# Patient Record
Sex: Female | Born: 1995 | Race: Black or African American | Hispanic: No | Marital: Single | Smoking: Former smoker
Health system: Southern US, Community
[De-identification: ages and names within clinical notes are randomized; demographics above are authoritative.]

## PROBLEM LIST (undated history)

## (undated) DIAGNOSIS — J45909 Unspecified asthma, uncomplicated: Secondary | ICD-10-CM

## (undated) HISTORY — PX: THERAPEUTIC ABORTION: SHX798

---
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
## 2015-12-30 ENCOUNTER — Emergency Department (HOSPITAL_COMMUNITY): Payer: 59

## 2015-12-30 ENCOUNTER — Encounter (HOSPITAL_COMMUNITY): Payer: Self-pay | Admitting: Emergency Medicine

## 2015-12-30 ENCOUNTER — Emergency Department (HOSPITAL_COMMUNITY)
Admission: EM | Admit: 2015-12-30 | Discharge: 2015-12-31 | Disposition: A | Discharge status: Home / Self Care | Payer: 59 | Attending: Emergency Medicine | Admitting: Emergency Medicine

## 2015-12-30 DIAGNOSIS — J45901 Unspecified asthma with (acute) exacerbation: Secondary | ICD-10-CM | POA: Diagnosis not present

## 2015-12-30 DIAGNOSIS — R0602 Shortness of breath: Secondary | ICD-10-CM

## 2015-12-30 DIAGNOSIS — Z791 Long term (current) use of non-steroidal anti-inflammatories (NSAID): Secondary | ICD-10-CM | POA: Insufficient documentation

## 2015-12-30 DIAGNOSIS — F17200 Nicotine dependence, unspecified, uncomplicated: Secondary | ICD-10-CM | POA: Diagnosis not present

## 2015-12-30 HISTORY — DX: Unspecified asthma, uncomplicated: J45.909

## 2015-12-30 LAB — BASIC METABOLIC PANEL
Anion gap: 11 (ref 5–15)
BUN: 11 mg/dL (ref 6–20)
CHLORIDE: 109 mmol/L (ref 101–111)
CO2: 20 mmol/L — ABNORMAL LOW (ref 22–32)
Calcium: 9 mg/dL (ref 8.9–10.3)
Creatinine, Ser: 0.93 mg/dL (ref 0.44–1.00)
GFR calc Af Amer: >60 mL/min (ref >60)
GFR calc non Af Amer: >60 mL/min (ref >60)
Glucose, Bld: 137 mg/dL — ABNORMAL HIGH (ref 65–99)
POTASSIUM: 3.3 mmol/L — AB (ref 3.5–5.1)
SODIUM: 140 mmol/L (ref 135–145)

## 2015-12-30 LAB — CBC WITH DIFFERENTIAL/PLATELET
Basophils Absolute: 0 10*3/uL (ref 0.0–0.1)
Basophils Relative: 0 %
Eosinophils Absolute: 0.8 10*3/uL — ABNORMAL HIGH (ref 0.0–0.7)
Eosinophils Relative: 7 %
HCT: 42 % (ref 36.0–46.0)
HEMOGLOBIN: 14.4 g/dL (ref 12.0–15.0)
LYMPHS ABS: 4.4 10*3/uL — AB (ref 0.7–4.0)
LYMPHS PCT: 40 %
MCH: 27.6 pg (ref 26.0–34.0)
MCHC: 34.3 g/dL (ref 30.0–36.0)
MCV: 80.6 fL (ref 78.0–100.0)
Monocytes Absolute: 0.6 10*3/uL (ref 0.1–1.0)
Monocytes Relative: 6 %
NEUTROS PCT: 47 %
Neutro Abs: 5.2 10*3/uL (ref 1.7–7.7)
Platelets: 238 10*3/uL (ref 150–400)
RBC: 5.21 MIL/uL — AB (ref 3.87–5.11)
RDW: 15.2 % (ref 11.5–15.5)
WBC: 11 10*3/uL — AB (ref 4.0–10.5)

## 2015-12-30 MED ORDER — METHYLPREDNISOLONE SODIUM SUCC 125 MG IJ SOLR
125.0000 mg | Freq: Once | INTRAMUSCULAR | Status: AC
  Administered 2015-12-30: 125 mg via INTRAVENOUS
  Filled 2015-12-30: qty 2

## 2015-12-30 MED ORDER — ALBUTEROL SULFATE (2.5 MG/3ML) 0.083% IN NEBU
5.0000 mg | INHALATION_SOLUTION | Freq: Once | RESPIRATORY_TRACT | Status: AC
  Administered 2015-12-30: 5 mg via RESPIRATORY_TRACT
  Filled 2015-12-30: qty 6

## 2015-12-30 MED ORDER — ALBUTEROL (5 MG/ML) CONTINUOUS INHALATION SOLN
15.0000 mg | INHALATION_SOLUTION | RESPIRATORY_TRACT | Status: DC
  Administered 2015-12-30: 15 mg via RESPIRATORY_TRACT

## 2015-12-30 MED ORDER — IPRATROPIUM BROMIDE 0.02 % IN SOLN
RESPIRATORY_TRACT | Status: AC
  Filled 2015-12-30: qty 2.5

## 2015-12-30 MED ORDER — IPRATROPIUM BROMIDE 0.02 % IN SOLN
0.5000 mg | Freq: Once | RESPIRATORY_TRACT | Status: AC
  Administered 2015-12-30: 0.5 mg via RESPIRATORY_TRACT

## 2015-12-30 NOTE — ED Triage Notes (Signed)
Pt was assisted out of a car with c/o shortness of breath  Pt has labored breathing and audible wheezing  Pt unable to speak full sentences

## 2015-12-30 NOTE — ED Provider Notes (Signed)
By signing my name below, I, Emmanuella Mensah, attest that this documentation has been prepared under the direction and in the presence of Tacie Mccuistion N Paidyn Mcferran, DO. Electronically Signed: Angelene GiovanniEmmanuella Mensah, ED Scribe. 12/30/15. 11:11 PM.   TIME SEEN: 11:04 PM   CHIEF COMPLAINT: Shortness of Breath  HPI: Jaime Herman is a 20 y.o. female with a hx of asthma who presents to the Emergency Department complaining of gradually worsening moderate difficulty breathing consistent with her asthma exacerbation onset PTA. She reports associated labored breathing and difficulty speaking in complete sentences. She adds that she has had a non-productive cough for the past 3 days. No alleviating factors noted. She states that she has finished her inhaler and has not been able refill her prescription because her PCP is back in [place], [place]. No other medications noted PTA. She denies any past hospital admissions for these symptoms. No history of PE, DVT, exogenous estrogen use, fracture, surgery, trauma, hospitalization, prolonged travel. No lower extremity swelling or pain. No calf tenderness.  She states that she recently had a therapeutic abortion 2 weeks ago. She reports that she is current smoker. She denies any home O2 use. She denies any fever, chills, or chest pain.    ROS: See HPI Constitutional: no fever  Eyes: no drainage  ENT: no runny nose   Cardiovascular:  no chest pain  Resp: SOB  GI: no vomiting GU: no dysuria Integumentary: no rash  Allergy: no hives  Musculoskeletal: no leg swelling  Neurological: no slurred speech ROS otherwise negative  PAST MEDICAL HISTORY/PAST SURGICAL HISTORY:  Past Medical History:  Diagnosis Date  . Asthma     MEDICATIONS:  Prior to Admission medications   Medication Sig Start Date End Date Taking? Authorizing Provider  valACYclovir (VALTREX) 1000 MG tablet Take 1 tablet (1,000 mg total) by mouth 2 (two) times daily. 07/25/12   Linna HoffJames D Kindl, MD    ALLERGIES:   No Known Allergies  SOCIAL HISTORY:  Social History  Substance Use Topics  . Smoking status: Current Every Day Smoker  . Smokeless tobacco: Never Used  . Alcohol use Yes     Comment: rare    FAMILY HISTORY: No family history on file.  EXAM: BP (!) 151/123 (BP Location: Right Arm)   Pulse (!) 131   Temp 97.7 F (36.5 C) (Oral)   Resp 25   SpO2 94% Comment: 2 L CONSTITUTIONAL: Alert and oriented and responds appropriately to questions. Well-appearing; well-nourished HEAD: Normocephalic EYES: Conjunctivae clear, PERRL ENT: normal nose; no rhinorrhea; moist mucous membranes NECK: Supple, no meningismus, no LAD  CARD: Regular and tachycardiac; S1 and S2 appreciated; no murmurs, no clicks, no rubs, no gallops RESP: Diffuse expiratory wheezes, no rhonchi or rales. Diminished at bases bilaterally. Speaking short sentences. Stats 80 % on RA; Tachypneic.  ABD/GI: Normal bowel sounds; non-distended; soft, non-tender, no rebound, no guarding, no peritoneal signs BACK:  The back appears normal and is non-tender to palpation, there is no CVA tenderness EXT: Normal ROM in all joints; non-tender to palpation; no edema; normal capillary refill; no cyanosis, no calf tenderness or swelling    SKIN: Normal color for age and race; warm; no rash NEURO: Moves all extremities equally, sensation to light touch intact diffusely, cranial nerves II through XII intact PSYCH: The patient's mood and manner are appropriate. Grooming and personal hygiene are appropriate.  MEDICAL DECISION MAKING: Patient here with asthma exacerbation. Was hypoxic, tachycardic, tachypneic. Speaking short sentences. Received 5 mg of albuterol with minimal improvement.  We'll give continuous albuterol, Atrovent, Solu-Medrol. We'll obtain labs, chest x-ray. No history of PE or DVT. No risk factors for the same.  ED PROGRESS: 12:35 AM  Pt's Labs are unremarkable. Chest x-ray is clear.  Patient still mildly tachycardic but oxygen  saturation is now 95% on room air. Surrounding some mild scattered x-ray wheezing but reports feeling "much better". We'll give another albuterol, Atrovent treatment, reassess and he really patient without oxygen.    Patient's lungs are now clear after albuterol, Atrovent. Able to ambulate without her oxygen saturation dropping below 95%. Heart rate has also improved. I feel she is safe to be discharged home. We'll discharge with albuterol inhaler and prednisone burst. Have given her local PCP follow-up information. Have recommended she quit smoking. Discussed return precautions. She verbalized understanding and is comfortable with this plan.   At this time, I do not feel there is any life-threatening condition present. I have reviewed and discussed all results (EKG, imaging, lab, urine as appropriate), exam findings with patient/family. I have reviewed nursing notes and appropriate previous records.  I feel the patient is safe to be discharged home without further emergent workup and can continue workup as an outpatient as needed. Discussed usual and customary return precautions. Patient/family verbalize understanding and are comfortable with this plan.  Outpatient follow-up has been provided. All questions have been answered.    CRITICAL CARE Performed by: Raelyn Number   Total critical care time: 45 minutes  Critical care time was exclusive of separately billable procedures and treating other patients.  Critical care was necessary to treat or prevent imminent or life-threatening deterioration.  Critical care was time spent personally by me on the following activities: development of treatment plan with patient and/or surrogate as well as nursing, discussions with consultants, evaluation of patient's response to treatment, examination of patient, obtaining history from patient or surrogate, ordering and performing treatments and interventions, ordering and review of laboratory studies,  ordering and review of radiographic studies, pulse oximetry and re-evaluation of patient's condition.    EKG Interpretation  Date/Time:  Tuesday December 30 2015 23:20:56 EDT Ventricular Rate:  106 PR Interval:    QRS Duration: 92 QT Interval:  339 QTC Calculation: 451 R Axis:   74 Text Interpretation:  Sinus tachycardia Biatrial enlargement RSR' in V1 or V2, right VCD or RVH Artifact in lead(s) I II III aVR aVL aVF V4 V5 No old tracing to compare Confirmed by Colm Lyford,  DO, Jelan Batterton ([id number]) on 12/30/2015 11:44:37 PM       I personally performed the services described in this documentation, which was scribed in my presence. The recorded information has been reviewed and is accurate.    Layla Maw Rolen Conger, DO 12/31/15 0500

## 2015-12-31 MED ORDER — PREDNISONE 20 MG PO TABS: 60.0000 mg | ORAL_TABLET | Freq: Every day | ORAL | 0 refills | Status: DC

## 2015-12-31 MED ORDER — ALBUTEROL SULFATE HFA 108 (90 BASE) MCG/ACT IN AERS: 2.0000 | INHALATION_SPRAY | RESPIRATORY_TRACT | 3 refills | Status: AC | PRN

## 2015-12-31 MED ORDER — ALBUTEROL SULFATE (2.5 MG/3ML) 0.083% IN NEBU
5.0000 mg | INHALATION_SOLUTION | Freq: Once | RESPIRATORY_TRACT | Status: AC
  Administered 2015-12-31: 5 mg via RESPIRATORY_TRACT
  Filled 2015-12-31: qty 6

## 2015-12-31 MED ORDER — IPRATROPIUM BROMIDE 0.02 % IN SOLN
1.0000 mg | Freq: Once | RESPIRATORY_TRACT | Status: AC
  Administered 2015-12-31: 1 mg via RESPIRATORY_TRACT
  Filled 2015-12-31: qty 5

## 2015-12-31 MED ORDER — ALBUTEROL SULFATE HFA 108 (90 BASE) MCG/ACT IN AERS
2.0000 | INHALATION_SPRAY | RESPIRATORY_TRACT | Status: DC | PRN
  Administered 2015-12-31: 2 via RESPIRATORY_TRACT
  Filled 2015-12-31: qty 6.7

## 2015-12-31 NOTE — ED Notes (Signed)
Pt O2 was 96% and pulse was 108

## 2015-12-31 NOTE — Discharge Instructions (Signed)
Please follow-up with your primary care physician or a local primary care physician next 2-3 days.   To find a primary care or specialty doctor please call [phone] or [phone] to access "[place] Find a Doctor Service."  You may also go on the [facility] website at [email or web address]  There are also multiple [facility] practices throughout the [place] that are frequently accepting new patients. You may find a clinic that is close to your home and contact them.  [facility] -  [address] [place] [id number] [phone]  [facility] in [place] (also locations in [place] and [place]) -  [address] [place] [address] [phone]  [facility] -  [address] [place] [address] [phone]

## 2015-12-31 NOTE — ED Notes (Signed)
Patient was alert, oriented and stable upon discharge. RN went over AVS and patient had no further questions.  

## 2016-02-04 ENCOUNTER — Encounter (HOSPITAL_COMMUNITY): Payer: Self-pay | Admitting: *Deleted

## 2016-02-04 ENCOUNTER — Emergency Department (HOSPITAL_COMMUNITY)
Admission: EM | Admit: 2016-02-04 | Discharge: 2016-02-04 | Disposition: A | Discharge status: Home / Self Care | Payer: 59 | Attending: Emergency Medicine | Admitting: Emergency Medicine

## 2016-02-04 DIAGNOSIS — J4521 Mild intermittent asthma with (acute) exacerbation: Secondary | ICD-10-CM | POA: Diagnosis not present

## 2016-02-04 DIAGNOSIS — F17200 Nicotine dependence, unspecified, uncomplicated: Secondary | ICD-10-CM | POA: Insufficient documentation

## 2016-02-04 DIAGNOSIS — R0602 Shortness of breath: Secondary | ICD-10-CM | POA: Diagnosis present

## 2016-02-04 MED ORDER — PREDNISONE 20 MG PO TABS
40.0000 mg | ORAL_TABLET | Freq: Once | ORAL | Status: AC
  Administered 2016-02-04: 40 mg via ORAL
  Filled 2016-02-04: qty 2

## 2016-02-04 MED ORDER — GUAIFENESIN-CODEINE 100-10 MG/5ML PO SOLN
5.0000 mL | Freq: Once | ORAL | Status: AC
  Administered 2016-02-04: 5 mL via ORAL
  Filled 2016-02-04: qty 5

## 2016-02-04 MED ORDER — ALBUTEROL SULFATE (2.5 MG/3ML) 0.083% IN NEBU
5.0000 mg | INHALATION_SOLUTION | Freq: Once | RESPIRATORY_TRACT | Status: AC
  Administered 2016-02-04: 5 mg via RESPIRATORY_TRACT
  Filled 2016-02-04: qty 6

## 2016-02-04 MED ORDER — PREDNISONE 10 MG PO TABS: 20.0000 mg | ORAL_TABLET | Freq: Two times a day (BID) | ORAL | 0 refills | Status: DC

## 2016-02-04 MED ORDER — IPRATROPIUM-ALBUTEROL 0.5-2.5 (3) MG/3ML IN SOLN
3.0000 mL | Freq: Once | RESPIRATORY_TRACT | Status: AC
  Administered 2016-02-04: 3 mL via RESPIRATORY_TRACT
  Filled 2016-02-04: qty 3

## 2016-02-04 NOTE — ED Provider Notes (Signed)
[facility]EMERGENCY DEPT Provider Note   CSN: [id number] Arrival date & time: 02/04/16  2006  By signing my name below, I, Jaime Herman, attest that this documentation has been prepared under the direction and in the presence of Kerrie BuffaloHope Neese, NP. Electronically Signed: Angelene GiovanniEmmanuella Herman, ED Scribe. 02/04/16. 11:26 PM.   History   Chief Complaint Chief Complaint  Patient presents with  . Shortness of Breath    HPI Comments: Jaime Herman is a 20 y.o. female with a hx of asthma who presents to the Emergency Department complaining of gradual onset, moderate non-productive cough onset several days ago. She reports associated one episode of post tussive vomiting, wheezing, and chest tightness consistent with her asthma exacerbations. She notes that her asthma is worse with the cold weather and when she smokes marijuana. No alleviating factors noted. She reports that she has needed to use her inhaler everyday this past week with mild temporary relief. Pt has NKDA but states that she has noticed that when she takes ibuprofen, she has an asthma flare up so she does not take it anymore. She denies any fever, chills, nausea, generalized rash, or any other symptoms.   The history is provided by the patient. No language interpreter was used.  Wheezing   This is a chronic problem. The current episode started 2 days ago. The problem has been gradually worsening. Associated symptoms include vomiting (post tussive) and cough. Pertinent negatives include no fever and no rash. The problem's precipitants include smoke. Treatments tried: Albuterol inhaler. The treatment provided mild relief. She has had no prior hospitalizations. She has had prior ED visits. She has had no prior ICU admissions. Her past medical history is significant for asthma.    Past Medical History:  Diagnosis Date  . Asthma     There are no active problems to display for this patient.   Past Surgical History:  Procedure Laterality  Date  . THERAPEUTIC ABORTION      OB History    No data available       Home Medications    Prior to Admission medications   Medication Sig Start Date End Date Taking? Authorizing Provider  albuterol (PROVENTIL HFA;VENTOLIN HFA) 108 (90 Base) MCG/ACT inhaler Inhale 2-4 puffs into the lungs every 4 (four) hours as needed for wheezing or shortness of breath. 12/31/15  Yes Kristen N Ward, DO  predniSONE (DELTASONE) 10 MG tablet Take 2 tablets (20 mg total) by mouth 2 (two) times daily with a meal. 02/04/16   Hope Orlene OchM Neese, NP    Family History No family history on file.  Social History Social History  Substance Use Topics  . Smoking status: Current Every Day Smoker  . Smokeless tobacco: Never Used  . Alcohol use Yes     Comment: rare     Allergies   Ibuprofen   Review of Systems Review of Systems  Constitutional: Negative for chills and fever.  Respiratory: Positive for cough, chest tightness and wheezing.   Gastrointestinal: Positive for vomiting (post tussive). Negative for nausea.  Skin: Negative for rash.     Physical Exam Updated Vital Signs BP 112/64 (BP Location: Right Arm)   Pulse 92   Temp 98.5 F (36.9 C) (Oral)   Resp 16   Ht 5' 3.5" (1.613 m)   Wt 68 kg   LMP 02/03/2016   SpO2 100%   BMI 26.15 kg/m   Physical Exam  Constitutional: She is oriented to person, place, and time. She appears well-developed and well-nourished.  No distress.  HENT:  Head: Normocephalic and atraumatic.  Mouth/Throat: Uvula is midline and oropharynx is clear and moist. No posterior oropharyngeal edema or posterior oropharyngeal erythema.  Eyes: Conjunctivae and EOM are normal.  Neck: Neck supple. No tracheal deviation present.  Cardiovascular: Normal rate.   Pulmonary/Chest: Effort normal. No respiratory distress. She has wheezes.  Inspiratory and expiratory wheezes bilaterally   Musculoskeletal: Normal range of motion.  Neurological: She is alert and oriented to  person, place, and time.  Skin: Skin is warm and dry.  Psychiatric: She has a normal mood and affect. Her behavior is normal.  Nursing note and vitals reviewed.    ED Treatments / Results  DIAGNOSTIC STUDIES: Oxygen Saturation is 97% on RA, normal by my interpretation.    COORDINATION OF CARE: 9:57 PM- Pt advised of plan for treatment and pt agrees. Pt will receive guaifenesin-codeine, prednisone, and neb treatment x2.   11:25 PM - After 2nd duo neb treatment, pt's lungs are clear and she reports that she has improved and is feeling better.   Labs (all labs ordered are listed, but only abnormal results are displayed) Labs Reviewed - No data to display  Radiology No results found.  Procedures Procedures (including critical care time)  Medications Ordered in ED Medications  albuterol (PROVENTIL) (2.5 MG/3ML) 0.083% nebulizer solution 5 mg (5 mg Nebulization Given 02/04/16 2055)  ipratropium-albuterol (DUONEB) 0.5-2.5 (3) MG/3ML nebulizer solution 3 mL (3 mLs Nebulization Given 02/04/16 2229)  predniSONE (DELTASONE) tablet 40 mg (40 mg Oral Given 02/04/16 2227)  guaiFENesin-codeine 100-10 MG/5ML solution 5 mL (5 mLs Oral Given 02/04/16 2227)     Initial Impression / Assessment and Plan / ED Course  Kerrie BuffaloHope Neese, NP has reviewed the triage vital signs and the nursing notes.   Clinical Course   Patient ambulated in ED with O2 saturations maintained >90, no current signs of respiratory distress. Lung exam improved after nebulizer treatment. Prednisone given in the ED and pt will be dc with 5 day burst. Pt states sheis  breathing at baseline. Pt has been instructed to continue using prescribed medications and to speak with PCP about today's exacerbation.    Final Clinical Impressions(s) / ED Diagnoses   Final diagnoses:  Mild intermittent asthma with exacerbation    New Prescriptions Discharge Medication List as of 02/04/2016 11:25 PM     I personally performed the services  described in this documentation, which was scribed in my presence. The recorded information has been reviewed and is accurate.    [facility] Orlene OchM Neese, NP 02/05/16 0143    Layla MawKristen N Ward, DO 02/05/16 [phone]

## 2016-02-04 NOTE — ED Triage Notes (Signed)
Patient presents with c/o dry cough "forever", wheezing and feeling SOB

## 2016-02-04 NOTE — ED Notes (Signed)
See EDP assessment 

## 2017-03-22 NOTE — L&D Delivery Note (Addendum)
Delivery Note Pt labored quickly on cytotec to complete. She involuntarily begun pushing at 8-9cm.  At 5:17 AM a viable female was delivered via Vaginal, Spontaneous (Presentation:LOA ;  ).  APGAR: 7, 9; weight pending.  I arrived a minute after baby delivered. I clamped the cord and FOB cut it.  Numbed perineum with 1% lidocaine and performed repair of second degree laceration. Pt tolerated well Placental status:delivered, intact, Cord:3vc with the following complications: none.  Cord pH: n/a  Anesthesia:  1% lidocaine Episiotomy: None Lacerations: 2nd degree;Perineal;Periurethral Suture Repair: 2.0 vicryl Est. Blood Loss (mL): 530  Mom to postpartum.  Baby to Couplet care / Skin to Skin  Circ to be performed in office .  Cathrine MusterCecilia W Jaquelin Meaney 02/25/2018, 5:55 AM

## 2017-05-20 IMAGING — DX DG CHEST 1V PORT
1 series · 1 of 1 positions shown · non-contrast
Comparison: None.

CLINICAL DATA: Acute onset of shortness of breath. Initial
encounter.

EXAM:
PORTABLE CHEST 1 VIEW

[chest ap]
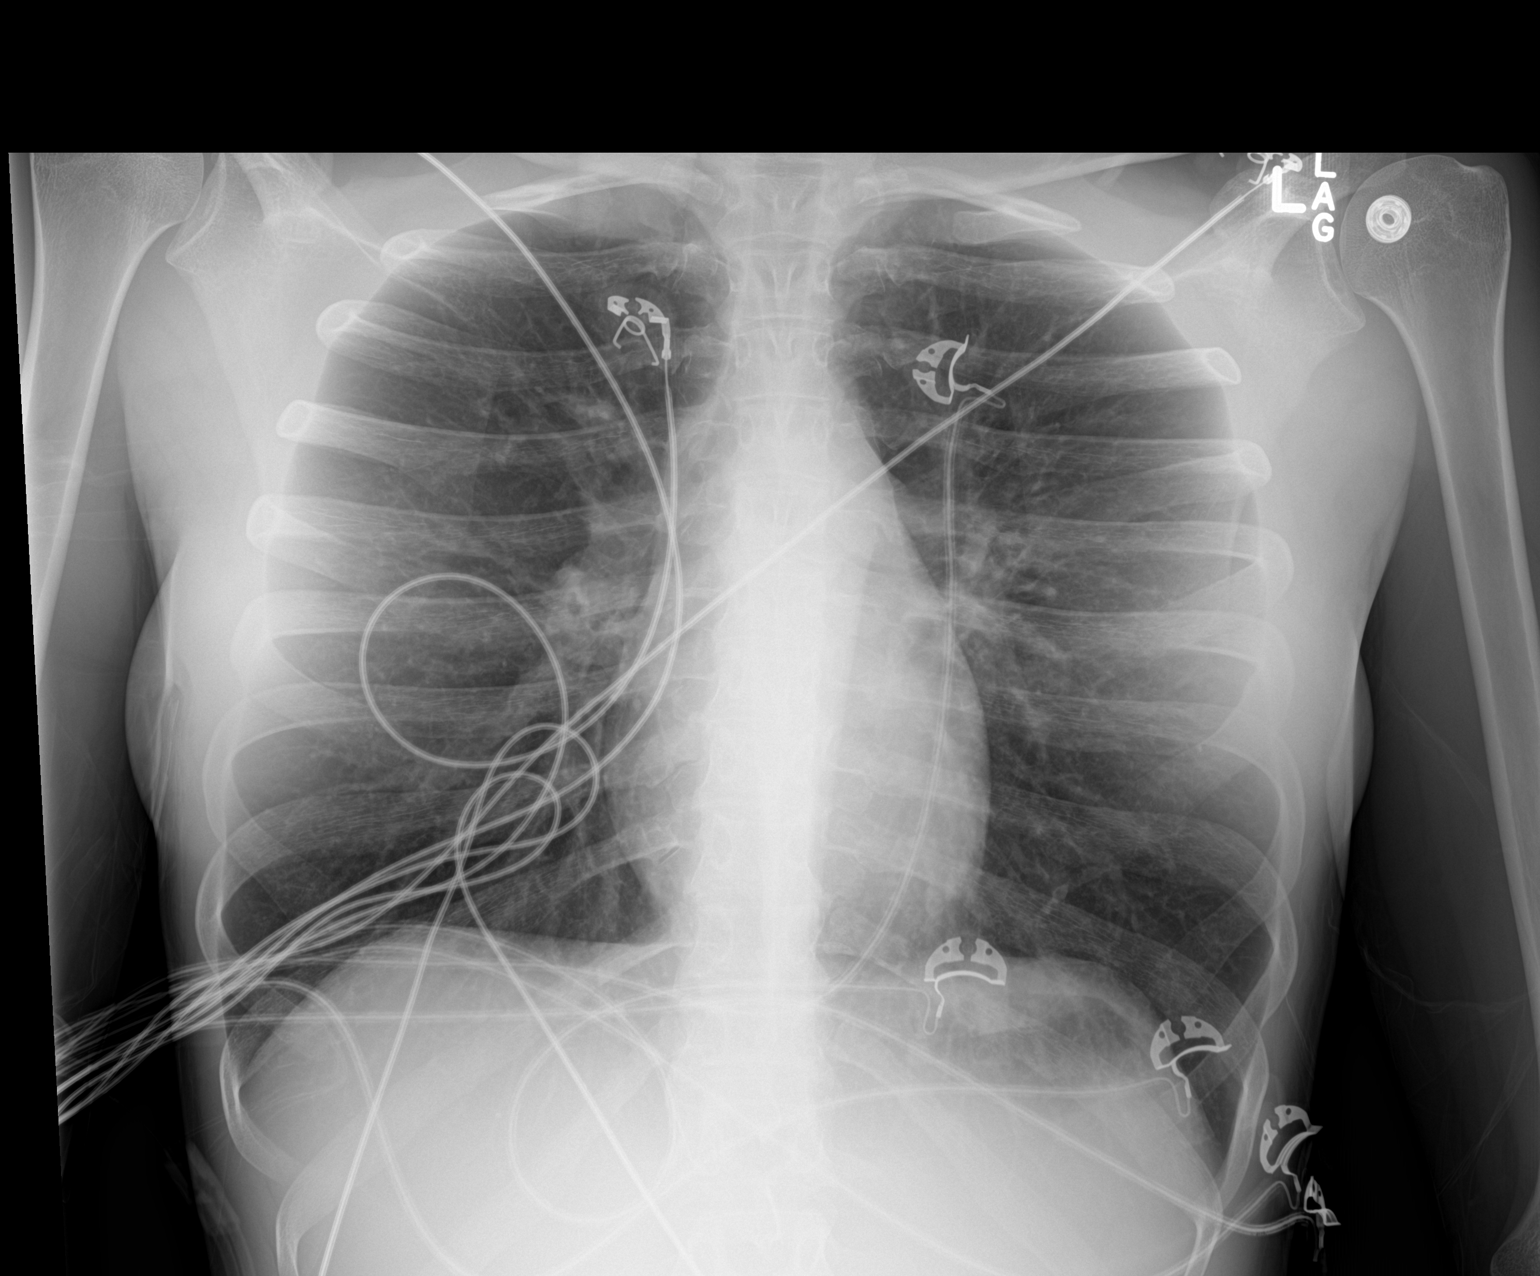

[1 of 1 positions shown; findings below may reference images not displayed]

FINDINGS: The lungs are well-aerated and clear. There is no evidence of focal
opacification, pleural effusion or pneumothorax.

The cardiomediastinal silhouette is within normal limits. No acute
osseous abnormalities are seen.
IMPRESSION: No acute cardiopulmonary process seen.

## 2017-08-12 ENCOUNTER — Encounter: Payer: Self-pay | Admitting: Pulmonary Disease

## 2017-08-12 ENCOUNTER — Ambulatory Visit (INDEPENDENT_AMBULATORY_CARE_PROVIDER_SITE_OTHER): Payer: 59 | Admitting: Pulmonary Disease

## 2017-08-12 VITALS — BP 128/70 | HR 88 | Ht 63.0 in | Wt 161.0 lb

## 2017-08-12 DIAGNOSIS — J4541 Moderate persistent asthma with (acute) exacerbation: Secondary | ICD-10-CM | POA: Diagnosis not present

## 2017-08-12 DIAGNOSIS — J339 Nasal polyp, unspecified: Secondary | ICD-10-CM | POA: Diagnosis not present

## 2017-08-12 DIAGNOSIS — J45909 Unspecified asthma, uncomplicated: Secondary | ICD-10-CM | POA: Diagnosis not present

## 2017-08-12 LAB — NITRIC OXIDE: NITRIC OXIDE: 201

## 2017-08-12 MED ORDER — MOMETASONE FUROATE 220 MCG/INH IN AEPB: 2.0000 | INHALATION_SPRAY | Freq: Two times a day (BID) | RESPIRATORY_TRACT | 0 refills | Status: DC

## 2017-08-12 MED ORDER — ALBUTEROL SULFATE HFA 108 (90 BASE) MCG/ACT IN AERS
2.00 | INHALATION_SPRAY | RESPIRATORY_TRACT | Status: DC
Start: ? — End: 2017-08-12

## 2017-08-12 MED ORDER — MOMETASONE FUROATE 220 MCG/INH IN AEPB: 2.0000 | INHALATION_SPRAY | Freq: Two times a day (BID) | RESPIRATORY_TRACT | 11 refills | Status: AC

## 2017-08-12 MED ORDER — ALBUTEROL SULFATE (5 MG/ML) 0.5% IN NEBU
20.00 | INHALATION_SOLUTION | RESPIRATORY_TRACT | Status: DC
Start: ? — End: 2017-08-12

## 2017-08-12 NOTE — Progress Notes (Signed)
Synopsis: Referred in May 2019 for Asthma  Subjective:   PATIENT ID: Jaime Herman GENDER: female DOB: 1995/11/10, MRN: [id number]   HPI  Chief Complaint  Patient presents with  . New Consult    frequent hospitalizations with Asthma flair ups    She is a 22 year old female who comes to my clinic today for evaluation of worsening chest tightness wheezing and shortness of breath.  She says that she did not have asthma as a child nor did she have problems with allergies.  However, when she moved to [place] in 2014 at age 1 she developed the abrupt onset of allergy symptoms.  Specifically she started having problems with nasal polyps, sinus congestion and postnasal drip as well as chest tightness wheezing and shortness of breath.  She has been to the emergency room several times for chest tightness, asthma flareups.  She has required prednisone on a recurrent basis for this.  She says that once her primary care physician gave her samples of Breo and this controlled her symptoms near completely.  However, since becoming pregnant [redacted] weeks ago she has not had any controller medicines and she feels that her symptoms are worsening.  She says she is now using albuterol up to 5 times a day to help relieve the sensation of shortness of breath.  She says that she coughs but is typically dry.  She does continue to have significant obstruction of airflow through her nose.  She has significant postnasal drip.  She is scheduled to see an ear nose and throat physician for nasal polyps.  She used to smoke marijuana cigars up to 5 times per day.  She quit smoking when she learned that she was pregnant.  She does not smoke cigarettes.  She currently does not work, in the past she has worked in Personnel officer.  Her boyfriend smokes in the house  Past Medical History:  Diagnosis Date  . Asthma      History reviewed. No pertinent family history.   Social History   Socioeconomic History  . Marital status:  Single    Spouse name: Not on file  . Number of children: Not on file  . Years of education: Not on file  . Highest education level: Not on file  Occupational History  . Not on file  Social Needs  . Financial resource strain: Not on file  . Food insecurity:    Worry: Not on file    Inability: Not on file  . Transportation needs:    Medical: Not on file    Non-medical: Not on file  Tobacco Use  . Smoking status: Former Smoker    Years: 6.00    Last attempt to quit: 06/29/2017    Years since quitting: 0.1  . Smokeless tobacco: Never Used  . Tobacco comment: THC  Substance and Sexual Activity  . Alcohol use: Yes    Comment: rare  . Drug use: Yes    Types: Marijuana  . Sexual activity: Not on file  Lifestyle  . Physical activity:    Days per week: Not on file    Minutes per session: Not on file  . Stress: Not on file  Relationships  . Social connections:    Talks on phone: Not on file    Gets together: Not on file    Attends religious service: Not on file    Active member of club or organization: Not on file    Attends meetings of clubs or organizations: Not  on file    Relationship status: Not on file  . Intimate partner violence:    Fear of current or ex partner: Not on file    Emotionally abused: Not on file    Physically abused: Not on file    Forced sexual activity: Not on file  Other Topics Concern  . Not on file  Social History Narrative  . Not on file     Allergies  Allergen Reactions  . Ibuprofen     Cant breath     Outpatient Medications Prior to Visit  Medication Sig Dispense Refill  . albuterol (PROVENTIL HFA;VENTOLIN HFA) 108 (90 Base) MCG/ACT inhaler Inhale 2-4 puffs into the lungs every 4 (four) hours as needed for wheezing or shortness of breath. 1 Inhaler 3  . predniSONE (DELTASONE) 10 MG tablet Take 2 tablets (20 mg total) by mouth 2 (two) times daily with a meal. 16 tablet 0   No facility-administered medications prior to visit.      Review of Systems  Constitutional: Negative for chills, fever, malaise/fatigue and weight loss.  HENT: Positive for congestion. Negative for nosebleeds, sinus pain and sore throat.   Eyes: Negative for photophobia, pain and discharge.  Respiratory: Positive for cough, shortness of breath and wheezing. Negative for hemoptysis and sputum production.   Cardiovascular: Negative for chest pain, palpitations, orthopnea and leg swelling.  Gastrointestinal: Negative for abdominal pain, constipation, diarrhea, nausea and vomiting.  Genitourinary: Negative for dysuria, frequency, hematuria and urgency.  Musculoskeletal: Negative for back pain, joint pain, myalgias and neck pain.  Skin: Negative for itching and rash.  Neurological: Negative for tingling, tremors, sensory change, speech change, focal weakness, seizures, weakness and headaches.  Endo/Heme/Allergies: Positive for environmental allergies.  Psychiatric/Behavioral: Negative for memory loss, substance abuse and suicidal ideas. The patient is nervous/anxious.       Objective:  Physical Exam   Vitals:   08/12/17 1124  BP: 128/70  Pulse: 88  SpO2: 98%  Weight: 161 lb (73 kg)  Height:  (1.6 m)   RA  Gen: well appearing, no acute distress HENT: NCAT, OP clear, neck supple without masses Eyes: PERRL, EOMi Lymph: no cervical lymphadenopathy PULM: Wheezing bilaterally, good air movement CV: RRR, no mgr, no JVD GI: BS+, soft, nontender, no hsm Derm: no rash or skin breakdown MSK: normal bulk and tone Neuro: A&Ox4, CN II-XII intact, strength 5/5 in all 4 extremities Psyche: normal mood and affect   CBC    Component Value Date/Time   WBC 11.0 (H) 12/30/2015 2315   RBC 5.21 (H) 12/30/2015 2315   HGB 14.4 12/30/2015 2315   HCT 42.0 12/30/2015 2315   PLT 238 12/30/2015 2315   MCV 80.6 12/30/2015 2315   MCH 27.6 12/30/2015 2315   MCHC 34.3 12/30/2015 2315   RDW 15.2 12/30/2015 2315   LYMPHSABS 4.4 (H) 12/30/2015  2315   MONOABS 0.6 12/30/2015 2315   EOSABS 0.8 (H) 12/30/2015 2315   BASOSABS 0.0 12/30/2015 2315     Chest imaging: October 2017 chest x-ray images independently reviewed showing hyperinflation, otherwise normal pulmonary parenchyma  PFT: May 2019 ratio 49%, FEV1 1.59 L 57% predicted, FVC 3.2 to 102% predicted  Exhaled NO: 07/2017 201 ppm  Labs:  Path:  Echo:  Heart Catheterization:  Records from her ER visit for an asthma exacerbation reviewed, they noted she was smoking marijuana prior to the visit.     Assessment & Plan:   Uncomplicated asthma, unspecified asthma severity, unspecified whether persistent - Plan:  Nitric oxide, Spirometry with Graph  Moderate persistent asthma with acute exacerbation  Nasal polyposis  Discussion: This is a pleasant 22 year old female who has poorly controlled asthma.  Symptoms have worsened since becoming pregnant but in general she has had fairly severe symptoms ever since moving here 5 years ago.  She describes fairly frequent exacerbations of her asthma and she requires albuterol use for more than normal.  This is complicated by allergic rhinitis and nasal polyps.  I explained to her that her exposure to tobacco smoke, her poorly controlled allergic rhinitis, and her pregnancy all contribute to the severity of her asthma.  I am hopeful that with the addition of a controller medicine we can see improvement in her symptoms.  In turn, having poorly controlled asthma is a risk factor for complications of pregnancy.   Moderate persistent asthma with recurrent exacerbations: Spirometry test today Exhaled nitric oxide test today Start Asmanex 220 puffs twice a day no matter how you feel Call us if you are still having to use albuterol more than twice per week after 2 weeks of Asmanex use We will plan on seeing you back in 6 weeks or sooner if needed  Allergic rhinitis with nasal polyps: Use Flonase over-the-counter 2 sprays each nostril  daily Keep follow-up appointment with ear nose and throat  We will see you back in 6 weeks or sooner if needed    Current Outpatient Medications:  .  albuterol (PROVENTIL HFA;VENTOLIN HFA) 108 (90 Base) MCG/ACT inhaler, Inhale 2-4 puffs into the lungs every 4 (four) hours as needed for wheezing or shortness of breath., Disp: 1 Inhaler, Rfl: 3

## 2017-08-12 NOTE — Patient Instructions (Signed)
Moderate persistent asthma with recurrent exacerbations: Spirometry test today Exhaled nitric oxide test today Start Asmanex 220 puffs twice a day no matter how you feel Call us if you are still having to use albuterol more than twice per week after 2 weeks of Asmanex use We will plan on seeing you back in 6 weeks or sooner if needed  Allergic rhinitis with nasal polyps: Use Flonase over-the-counter 2 sprays each nostril daily Keep follow-up appointment with ear nose and throat  We will see you back in 6 weeks or sooner if needed

## 2017-08-22 LAB — OB RESULTS CONSOLE GC/CHLAMYDIA
CHLAMYDIA, DNA PROBE: NEGATIVE
GC PROBE AMP, GENITAL: NEGATIVE

## 2017-08-22 LAB — OB RESULTS CONSOLE HIV ANTIBODY (ROUTINE TESTING): HIV: NONREACTIVE

## 2017-08-22 LAB — OB RESULTS CONSOLE RPR: RPR: NONREACTIVE

## 2017-08-22 LAB — OB RESULTS CONSOLE ABO/RH: RH Type: POSITIVE

## 2017-08-22 LAB — OB RESULTS CONSOLE HEPATITIS B SURFACE ANTIGEN: Hepatitis B Surface Ag: NEGATIVE

## 2017-08-22 LAB — OB RESULTS CONSOLE RUBELLA ANTIBODY, IGM: RUBELLA: IMMUNE

## 2017-08-22 LAB — OB RESULTS CONSOLE ANTIBODY SCREEN: Antibody Screen: NEGATIVE

## 2017-09-28 ENCOUNTER — Ambulatory Visit: Payer: 59 | Admitting: Pulmonary Disease

## 2017-09-28 NOTE — Progress Notes (Deleted)
   Synopsis: Referred in May 2019 for Asthma  Subjective:   PATIENT ID: Jaime Herman GENDER: female DOB: 06/19/1995, MRN: [id number]   HPI  No chief complaint on file.   ***  Past Medical History:  Diagnosis Date  . Asthma       Review of Systems  Constitutional: Negative for chills, fever, malaise/fatigue and weight loss.  HENT: Positive for congestion. Negative for nosebleeds, sinus pain and sore throat.   Eyes: Negative for photophobia, pain and discharge.  Respiratory: Positive for cough, shortness of breath and wheezing. Negative for hemoptysis and sputum production.   Cardiovascular: Negative for chest pain, palpitations, orthopnea and leg swelling.  Gastrointestinal: Negative for abdominal pain, constipation, diarrhea, nausea and vomiting.  Genitourinary: Negative for dysuria, frequency, hematuria and urgency.  Musculoskeletal: Negative for back pain, joint pain, myalgias and neck pain.  Skin: Negative for itching and rash.  Neurological: Negative for tingling, tremors, sensory change, speech change, focal weakness, seizures, weakness and headaches.  Endo/Heme/Allergies: Positive for environmental allergies.  Psychiatric/Behavioral: Negative for memory loss, substance abuse and suicidal ideas. The patient is nervous/anxious.       Objective:  Physical Exam   There were no vitals filed for this visit. RA  ***   CBC    Component Value Date/Time   WBC 11.0 (H) 12/30/2015 2315   RBC 5.21 (H) 12/30/2015 2315   HGB 14.4 12/30/2015 2315   HCT 42.0 12/30/2015 2315   PLT 238 12/30/2015 2315   MCV 80.6 12/30/2015 2315   MCH 27.6 12/30/2015 2315   MCHC 34.3 12/30/2015 2315   RDW 15.2 12/30/2015 2315   LYMPHSABS 4.4 (H) 12/30/2015 2315   MONOABS 0.6 12/30/2015 2315   EOSABS 0.8 (H) 12/30/2015 2315   BASOSABS 0.0 12/30/2015 2315     Chest imaging: October 2017 chest x-ray images independently reviewed showing hyperinflation, otherwise normal pulmonary  parenchyma  PFT: May 2019 ratio 49%, FEV1 1.59 L 57% predicted, FVC 3.2 to 102% predicted  Exhaled NO: 07/2017 201 ppm  Labs:  Path:  Echo:  Heart Catheterization:  Records from her ER visit for an asthma exacerbation reviewed, they noted she was smoking marijuana prior to the visit.     Assessment & Plan:   No diagnosis found.  ***   Current Outpatient Medications:  .  albuterol (PROVENTIL HFA;VENTOLIN HFA) 108 (90 Base) MCG/ACT inhaler, Inhale 2-4 puffs into the lungs every 4 (four) hours as needed for wheezing or shortness of breath., Disp: 1 Inhaler, Rfl: 3 .  mometasone (ASMANEX 60 METERED DOSES) 220 MCG/INH inhaler, Inhale 2 puffs into the lungs 2 (two) times daily., Disp: 1 Inhaler, Rfl: 11 .  mometasone (ASMANEX 60 METERED DOSES) 220 MCG/INH inhaler, Inhale 2 puffs into the lungs 2 (two) times daily., Disp: 1 Inhaler, Rfl: 0

## 2017-11-10 ENCOUNTER — Encounter: Payer: Self-pay | Admitting: Pulmonary Disease

## 2017-11-10 ENCOUNTER — Ambulatory Visit (INDEPENDENT_AMBULATORY_CARE_PROVIDER_SITE_OTHER): Payer: 59 | Admitting: Pulmonary Disease

## 2017-11-10 VITALS — BP 138/90 | HR 96 | Ht 64.57 in | Wt 194.0 lb

## 2017-11-10 DIAGNOSIS — Z886 Allergy status to analgesic agent status: Secondary | ICD-10-CM

## 2017-11-10 DIAGNOSIS — J339 Nasal polyp, unspecified: Secondary | ICD-10-CM

## 2017-11-10 DIAGNOSIS — J4541 Moderate persistent asthma with (acute) exacerbation: Secondary | ICD-10-CM | POA: Diagnosis not present

## 2017-11-10 DIAGNOSIS — J45909 Unspecified asthma, uncomplicated: Secondary | ICD-10-CM | POA: Diagnosis not present

## 2017-11-10 DIAGNOSIS — Z23 Encounter for immunization: Secondary | ICD-10-CM | POA: Diagnosis not present

## 2017-11-10 NOTE — Patient Instructions (Signed)
Eosinophilic asthma: Continue taking Asmanex as you are doing Use albuterol as needed for chest tightness wheezing or shortness of breath Flu shot today Stay active Practice good hand hygiene After the baby is born we will start backing down on the Asmanex  Nasal polyps: Continue follow-up with ENT Ask your primary care/obstetrician if you can take montelukast  Salicylate allergy: There are several resources available online that are informative in regards to nonmedicinal sources of salicylate in our diets and health and beauty a products.  I recommend you look these up.  We will see you back in February 2020 or sooner if needed

## 2017-11-10 NOTE — Progress Notes (Signed)
Synopsis: Referred in May 2019 for Asthma, has nasal polyps and salicylate allergy  Subjective:   PATIENT ID: Jaime Herman Davia GENDER: female DOB: 08/09/1995, MRN: [id number]   HPI  Chief Complaint  Patient presents with  . Follow-up    Fredric MareBailey has been doing much better since last visit.  She is compliant with her Asmanex which she says has been very helpful.  She has not had problems with chest tightness wheezing or shortness of breath.  She says that she has not had to use her albuterol.  No recent episodes of bronchitis.  She has been staying active.  Her due date is February 24, 2018.  She is still struggling with significant nasal polyps and congestion.  Past Medical History:  Diagnosis Date  . Asthma       Review of Systems  Constitutional: Negative for chills, fever, malaise/fatigue and weight loss.  HENT: Positive for congestion. Negative for nosebleeds, sinus pain and sore throat.   Eyes: Negative for photophobia, pain and discharge.  Respiratory: Positive for cough, shortness of breath and wheezing. Negative for hemoptysis and sputum production.   Cardiovascular: Negative for chest pain, palpitations, orthopnea and leg swelling.  Gastrointestinal: Negative for abdominal pain, constipation, diarrhea, nausea and vomiting.  Genitourinary: Negative for dysuria, frequency, hematuria and urgency.  Musculoskeletal: Negative for back pain, joint pain, myalgias and neck pain.  Skin: Negative for itching and rash.  Neurological: Negative for tingling, tremors, sensory change, speech change, focal weakness, seizures, weakness and headaches.  Endo/Heme/Allergies: Positive for environmental allergies.  Psychiatric/Behavioral: Negative for memory loss, substance abuse and suicidal ideas. The patient is nervous/anxious.       Objective:  Physical Exam   Vitals:   11/10/17 0935  BP: 138/90  Pulse: 96  SpO2: 99%  Weight: 194 lb (88 kg)  Height: 5' 4.57" (1.64 m)    RA  Gen: well appearing HENT: OP clear, TM's clear, neck supple PULM: CTA B, normal percussion CV: RRR, no mgr, trace edema GI: BS+, soft, nontender Derm: no cyanosis or rash Psyche: normal mood and affect   CBC    Component Value Date/Time   WBC 11.0 (H) 12/30/2015 2315   RBC 5.21 (H) 12/30/2015 2315   HGB 14.4 12/30/2015 2315   HCT 42.0 12/30/2015 2315   PLT 238 12/30/2015 2315   MCV 80.6 12/30/2015 2315   MCH 27.6 12/30/2015 2315   MCHC 34.3 12/30/2015 2315   RDW 15.2 12/30/2015 2315   LYMPHSABS 4.4 (H) 12/30/2015 2315   MONOABS 0.6 12/30/2015 2315   EOSABS 0.8 (H) 12/30/2015 2315   BASOSABS 0.0 12/30/2015 2315     Chest imaging: October 2017 chest x-ray images independently reviewed showing hyperinflation, otherwise normal pulmonary parenchyma  PFT: May 2019 ratio 49%, FEV1 1.59 L 57% predicted, FVC 3.2 to 102% predicted  Exhaled NO: 07/2017 201 ppm  Labs:  Path:  Echo:  Heart Catheterization:  Records from her ER visit for an asthma exacerbation reviewed, they noted she was smoking marijuana prior to the visit.     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  Specifically she has nasal polyps, ibuprofen allergy, and significant asthma.  Her asthma symptoms have been more significantly controlled with the addition of Asmanex.  I would like for her to check with her obstetrician if  it is possible for her to take montelukast as this can be helpful for the nasal polyps.  I have encouraged her to use nasal steroids but she says that these have been ineffective.  Plan: Eosinophilic asthma: Continue taking Asmanex as you are doing Use albuterol as needed for chest tightness wheezing or shortness of breath Flu shot  today Stay active Practice good hand hygiene After the baby is born we will start backing down on the Asmanex  Nasal polyps: Continue follow-up with ENT Ask your primary care/obstetrician if you can take montelukast  Salicylate allergy: There are several resources available online that are informative in regards to nonmedicinal sources of salicylate in our diets and health and beauty a products.  I recommend you look these up.  We will see you back in February 2020 or sooner if needed    Current Outpatient Medications:  .  Prenatal Vit-Fe Fumarate-FA (PRENATAL MULTIVITAMIN) TABS tablet, Take 1 tablet by mouth daily at 12 noon., Disp: , Rfl:  .  albuterol (PROVENTIL HFA;VENTOLIN HFA) 108 (90 Base) MCG/ACT inhaler, Inhale 2-4 puffs into the lungs every 4 (four) hours as needed for wheezing or shortness of breath., Disp: 1 Inhaler, Rfl: 3 .  mometasone (ASMANEX 60 METERED DOSES) 220 MCG/INH inhaler, Inhale 2 puffs into the lungs 2 (two) times daily., Disp: 1 Inhaler, Rfl: 11

## 2018-02-06 LAB — OB RESULTS CONSOLE GBS: STREP GROUP B AG: POSITIVE

## 2018-02-20 ENCOUNTER — Encounter (HOSPITAL_COMMUNITY): Payer: Self-pay | Admitting: *Deleted

## 2018-02-20 ENCOUNTER — Telehealth (HOSPITAL_COMMUNITY): Payer: Self-pay | Admitting: *Deleted

## 2018-02-20 NOTE — Telephone Encounter (Signed)
Preadmission screen  

## 2018-02-24 NOTE — H&P (Deleted)
  The note originally documented on this encounter has been moved the the encounter in which it belongs.  

## 2018-02-24 NOTE — H&P (Signed)
Jaime Herman is a 22 y.o. G40P0020 female presenting at 55 1/7wks for elective iol at term with favorable cervix. She is dated per 13 week [place]. 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 GBS positive NKDA  OB History    Gravida  3   Para      Term      Preterm      AB  2   Living        SAB      TAB  2   Ectopic      Multiple      Live Births             Past Medical History:  Diagnosis Date  . Asthma    Past Surgical History:  Procedure Laterality Date  . THERAPEUTIC ABORTION     Family History: family history is not on file. Social History:  reports that she quit smoking about 7 months ago. She quit after 6.00 years of use. She has never used smokeless tobacco. She reports that she drinks alcohol. She reports that she has current or past drug history. Drug: Marijuana.     Maternal Diabetes: No Genetic Screening: Abnormal:  Results: Other:sma and cf carrier Maternal Ultrasounds/Referrals: Normal Fetal Ultrasounds or other Referrals:  None Maternal Substance Abuse:  No Significant Maternal Medications:  None Significant Maternal Lab Results:  Lab values include: Group B Strep positive Other Comments:  None  Review of Systems  Constitutional: Negative for chills, fever, malaise/fatigue and weight loss.  Eyes: Negative for blurred vision.  Respiratory: Negative for shortness of breath.   Cardiovascular: Negative for chest pain.  Gastrointestinal: Negative for heartburn, nausea and vomiting.  Genitourinary: Negative for dysuria.  Musculoskeletal: Negative for myalgias.  Skin: Negative for itching and rash.  Endo/Heme/Allergies: Does not bruise/bleed easily.  Psychiatric/Behavioral: Negative for depression, hallucinations, substance abuse and suicidal ideas. The patient is nervous/anxious.    Maternal Medical History:  Reason for admission: Nausea. Term pregnancy with favorable cervix  Contractions: Frequency: rare.   Perceived severity is mild.    Fetal  activity: Perceived fetal activity is normal.   Last perceived fetal movement was within the past hour.    Prenatal complications: no prenatal complications Prenatal Complications - Diabetes: none.      There were no vitals taken for this visit. Maternal Exam:  Uterine Assessment: Contraction frequency is rare.   Abdomen: Patient reports generalized tenderness.  Estimated fetal weight is AGA.   Fetal presentation: vertex  Introitus: Normal vulva. Vulva is negative for condylomata and lesion.  Normal vagina.  Vagina is negative for condylomata.  Pelvis: adequate for delivery.   Cervix: Cervix evaluated by digital exam.     Physical Exam  Constitutional: She is oriented to person, place, and time. She appears well-developed and well-nourished.  Eyes: Pupils are equal, round, and reactive to light.  Neck: Normal range of motion.  Cardiovascular: Normal rate.  Respiratory: Effort normal.  GI: Soft. There is generalized tenderness.  Genitourinary: Vagina normal and uterus normal. Vulva exhibits no lesion.  Musculoskeletal: Normal range of motion.  Neurological: She is alert and oriented to person, place, and time.  Skin: Skin is warm.  Psychiatric: She has a normal mood and affect. Her behavior is normal. Judgment and thought content normal.    Prenatal labs: ABO, Rh: O/Positive/-- (06/03 0000) Antibody: Negative (06/03 0000) Rubella: Immune (06/03 0000) RPR: Nonreactive (06/03 0000)  HBsAg: Negative (06/03 0000)  HIV: Non-reactive (06/03 0000)  GBS:  Positive (11/18 0000)   Assessment/Plan: 22yo G3P0020 at 40 1/[redacted]wks gestation for iol  Cytotec for ripening PCN for GBS treatment Pain control prn Anticipate svd   Betha Shadix W Nycholas Rayner 02/24/2018, 6:21 PM

## 2018-02-25 ENCOUNTER — Inpatient Hospital Stay (HOSPITAL_COMMUNITY)
Admission: RE | Admit: 2018-02-25 | Discharge: 2018-02-27 | DRG: 807 | Disposition: A | Discharge status: Home / Self Care | Payer: 59 | Attending: Obstetrics and Gynecology | Admitting: Obstetrics and Gynecology

## 2018-02-25 ENCOUNTER — Encounter (HOSPITAL_COMMUNITY): Payer: Self-pay

## 2018-02-25 DIAGNOSIS — Z3490 Encounter for supervision of normal pregnancy, unspecified, unspecified trimester: Secondary | ICD-10-CM

## 2018-02-25 DIAGNOSIS — Z87891 Personal history of nicotine dependence: Secondary | ICD-10-CM | POA: Diagnosis not present

## 2018-02-25 DIAGNOSIS — O99824 Streptococcus B carrier state complicating childbirth: Secondary | ICD-10-CM | POA: Diagnosis present

## 2018-02-25 DIAGNOSIS — O26893 Other specified pregnancy related conditions, third trimester: Secondary | ICD-10-CM | POA: Diagnosis present

## 2018-02-25 DIAGNOSIS — Z3A40 40 weeks gestation of pregnancy: Secondary | ICD-10-CM | POA: Diagnosis not present

## 2018-02-25 DIAGNOSIS — Z141 Cystic fibrosis carrier: Secondary | ICD-10-CM

## 2018-02-25 DIAGNOSIS — Z23 Encounter for immunization: Secondary | ICD-10-CM

## 2018-02-25 LAB — CBC
HCT: 38.8 % (ref 36.0–46.0)
Hemoglobin: 13.1 g/dL (ref 12.0–15.0)
MCH: 27.8 pg (ref 26.0–34.0)
MCHC: 33.8 g/dL (ref 30.0–36.0)
MCV: 82.2 fL (ref 80.0–100.0)
Platelets: 170 10*3/uL (ref 150–400)
RBC: 4.72 MIL/uL (ref 3.87–5.11)
RDW: 14.5 % (ref 11.5–15.5)
WBC: 10.1 10*3/uL (ref 4.0–10.5)

## 2018-02-25 LAB — ABO/RH: ABO/RH(D): O POS

## 2018-02-25 LAB — TYPE AND SCREEN
ABO/RH(D): O POS
Antibody Screen: NEGATIVE

## 2018-02-25 LAB — RPR: RPR Ser Ql: NONREACTIVE

## 2018-02-25 MED ORDER — BUTORPHANOL TARTRATE 1 MG/ML IJ SOLN: 1.0000 mg | INTRAMUSCULAR | Status: DC | PRN

## 2018-02-25 MED ORDER — OXYTOCIN 40 UNITS IN LACTATED RINGERS INFUSION - SIMPLE MED
2.5000 [IU]/h | INTRAVENOUS | Status: DC
  Administered 2018-02-25: 2.5 [IU]/h via INTRAVENOUS
  Filled 2018-02-25: qty 1000

## 2018-02-25 MED ORDER — WITCH HAZEL-GLYCERIN EX PADS: 1.0000 "application " | MEDICATED_PAD | CUTANEOUS | Status: DC | PRN

## 2018-02-25 MED ORDER — ACETAMINOPHEN 325 MG PO TABS
650.0000 mg | ORAL_TABLET | ORAL | Status: DC | PRN
  Administered 2018-02-25 – 2018-02-27 (×8): 650 mg via ORAL
  Filled 2018-02-25 (×8): qty 2

## 2018-02-25 MED ORDER — PNEUMOCOCCAL VAC POLYVALENT 25 MCG/0.5ML IJ INJ
0.5000 mL | INJECTION | INTRAMUSCULAR | Status: AC
  Administered 2018-02-27: 0.5 mL via INTRAMUSCULAR
  Filled 2018-02-25 (×2): qty 0.5

## 2018-02-25 MED ORDER — ONDANSETRON HCL 4 MG/2ML IJ SOLN: 4.0000 mg | INTRAMUSCULAR | Status: DC | PRN

## 2018-02-25 MED ORDER — ONDANSETRON HCL 4 MG PO TABS: 4.0000 mg | ORAL_TABLET | ORAL | Status: DC | PRN

## 2018-02-25 MED ORDER — SODIUM CHLORIDE 0.9 % IV SOLN
5.0000 10*6.[IU] | Freq: Once | INTRAVENOUS | Status: AC
  Administered 2018-02-25: 5 10*6.[IU] via INTRAVENOUS
  Filled 2018-02-25: qty 5

## 2018-02-25 MED ORDER — DIBUCAINE 1 % RE OINT: 1.0000 "application " | TOPICAL_OINTMENT | RECTAL | Status: DC | PRN

## 2018-02-25 MED ORDER — ONDANSETRON HCL 4 MG/2ML IJ SOLN: 4.0000 mg | Freq: Four times a day (QID) | INTRAMUSCULAR | Status: DC | PRN

## 2018-02-25 MED ORDER — PENICILLIN G 3 MILLION UNITS IVPB - SIMPLE MED
3.0000 10*6.[IU] | INTRAVENOUS | Status: DC
  Filled 2018-02-25 (×2): qty 100

## 2018-02-25 MED ORDER — LACTATED RINGERS IV SOLN
INTRAVENOUS | Status: DC
  Administered 2018-02-25: 01:00:00 via INTRAVENOUS

## 2018-02-25 MED ORDER — OXYCODONE HCL 5 MG PO TABS: 10.0000 mg | ORAL_TABLET | ORAL | Status: DC | PRN

## 2018-02-25 MED ORDER — SOD CITRATE-CITRIC ACID 500-334 MG/5ML PO SOLN: 30.0000 mL | ORAL | Status: DC | PRN

## 2018-02-25 MED ORDER — LACTATED RINGERS IV SOLN: 500.0000 mL | INTRAVENOUS | Status: DC | PRN

## 2018-02-25 MED ORDER — TETANUS-DIPHTH-ACELL PERTUSSIS 5-2.5-18.5 LF-MCG/0.5 IM SUSP: 0.5000 mL | Freq: Once | INTRAMUSCULAR | Status: DC

## 2018-02-25 MED ORDER — PRENATAL MULTIVITAMIN CH
1.0000 | ORAL_TABLET | Freq: Every day | ORAL | Status: DC
  Filled 2018-02-25 (×2): qty 1

## 2018-02-25 MED ORDER — OXYCODONE HCL 5 MG PO TABS: 5.0000 mg | ORAL_TABLET | ORAL | Status: DC | PRN

## 2018-02-25 MED ORDER — SENNOSIDES-DOCUSATE SODIUM 8.6-50 MG PO TABS
2.0000 | ORAL_TABLET | ORAL | Status: DC
  Administered 2018-02-26: 2 via ORAL
  Filled 2018-02-25: qty 2

## 2018-02-25 MED ORDER — MISOPROSTOL 25 MCG QUARTER TABLET
25.0000 ug | ORAL_TABLET | ORAL | Status: DC | PRN
  Administered 2018-02-25: 25 ug via VAGINAL
  Filled 2018-02-25 (×2): qty 1

## 2018-02-25 MED ORDER — ACETAMINOPHEN 325 MG PO TABS: 650.0000 mg | ORAL_TABLET | ORAL | Status: DC | PRN

## 2018-02-25 MED ORDER — BENZOCAINE-MENTHOL 20-0.5 % EX AERO: 1.0000 "application " | INHALATION_SPRAY | CUTANEOUS | Status: DC | PRN

## 2018-02-25 MED ORDER — SIMETHICONE 80 MG PO CHEW: 80.0000 mg | CHEWABLE_TABLET | ORAL | Status: DC | PRN

## 2018-02-25 MED ORDER — COCONUT OIL OIL: 1.0000 "application " | TOPICAL_OIL | Status: DC | PRN

## 2018-02-25 MED ORDER — LIDOCAINE HCL (PF) 1 % IJ SOLN
30.0000 mL | INTRAMUSCULAR | Status: AC | PRN
  Administered 2018-02-25: 30 mL via SUBCUTANEOUS
  Filled 2018-02-25: qty 30

## 2018-02-25 MED ORDER — DIPHENHYDRAMINE HCL 25 MG PO CAPS: 25.0000 mg | ORAL_CAPSULE | Freq: Four times a day (QID) | ORAL | Status: DC | PRN

## 2018-02-25 MED ORDER — OXYCODONE-ACETAMINOPHEN 5-325 MG PO TABS: 2.0000 | ORAL_TABLET | ORAL | Status: DC | PRN

## 2018-02-25 MED ORDER — TERBUTALINE SULFATE 1 MG/ML IJ SOLN
0.2500 mg | Freq: Once | INTRAMUSCULAR | Status: DC | PRN
  Filled 2018-02-25: qty 1

## 2018-02-25 MED ORDER — ZOLPIDEM TARTRATE 5 MG PO TABS: 5.0000 mg | ORAL_TABLET | Freq: Every evening | ORAL | Status: DC | PRN

## 2018-02-25 MED ORDER — OXYTOCIN BOLUS FROM INFUSION
500.0000 mL | Freq: Once | INTRAVENOUS | Status: AC
  Administered 2018-02-25: 500 mL via INTRAVENOUS

## 2018-02-25 MED ORDER — OXYCODONE-ACETAMINOPHEN 5-325 MG PO TABS: 1.0000 | ORAL_TABLET | ORAL | Status: DC | PRN

## 2018-02-25 NOTE — Progress Notes (Signed)
Patient ID: Jaime Herman, female   DOB: 06/27/1995, 22 y.o.   MRN: [id number]  I was called to Jaime Herman's room to be on standby for potential delivery as she was unmedicated and involuntarily pushing. She went on to push to a SVD @ 0517 of a VMI. Infant was dried and lifted to pt's abd. At that time, 1 minute after delivery, Dr Mindi SlickerBanga arrived and took over care.  [facility] 02/25/2018 6:36 AM

## 2018-02-25 NOTE — Lactation Note (Signed)
This note was copied from a baby's chart. Lactation Consultation Note:  P1, infant is 6 hours old. Mother reports that infant breastfed well at delivery.  Mother reports that she has latched infant several times since . Mother reports that her nipples are normally erect, but are now flat since she delivered.   I observed that mothers nipples are semi-flat but firm when stimulated.  Mother has a 4 finger span between her breast with bulbous nipple tissue as well as LGT on lower have of both breast., Mother reports breast changes during pregnancy. Assist Mother with hand expression and observed large drops of colostrum.   Assist mother with positioning infant in football hold. Infant opened his mouth wide but no observed suckling.  Lots of teaching with mother on basics of breastfeeding.  Advised mother to page for assistance when attempting to breastfeed infant again.  Discussed cue base feeding and cluster feeding.  Advised mother to breastfeed 8-12 times in 24 hours or more. Encouraged frequent STS. [facility], [facility] dept and phone services as needed.       Patient Name: Jaime Herman Reason for consult: Initial assessment   Maternal Data Has patient been taught Hand Expression?: Yes Does the patient have breastfeeding experience prior to this delivery?: No  Feeding Feeding Type: Breast Fed  LATCH Score Latch: Repeated attempts needed to sustain latch, nipple held in mouth throughout feeding, stimulation needed to elicit sucking reflex.  Audible Swallowing: A few with stimulation  Type of Nipple: Flat  Comfort (Breast/Nipple): Soft / non-tender  Hold (Positioning): Assistance needed to correctly position infant at breast and maintain latch.  LATCH Score: 6  Interventions Interventions: Breast feeding basics reviewed;Assisted with latch;Skin to skin;Hand express;Breast compression;Adjust position;Support  pillows;Position options;Expressed milk;Shells  Lactation Tools Discussed/Used     Consult Status Consult Status: Follow-up Date: 02/26/18 Follow-up type: In-patient    Stevan BornKendrick, Deniya Craigo [facility], 12:15 PM

## 2018-02-25 NOTE — Progress Notes (Signed)
Patient ID: Jaime Herman, female   DOB: 07/03/1995, 22 y.o.   MRN: [id number] Pt comfortable in postpartum room. No complaints.Skin to skin with baby VSS ABD - FF GU - mild lochia EXT - no edema  A/P: PPD#0 - stable         Routine pp care

## 2018-02-26 LAB — CBC
HCT: 38 % (ref 36.0–46.0)
Hemoglobin: 12.6 g/dL (ref 12.0–15.0)
MCH: 27.9 pg (ref 26.0–34.0)
MCHC: 33.2 g/dL (ref 30.0–36.0)
MCV: 84.3 fL (ref 80.0–100.0)
PLATELETS: 210 10*3/uL (ref 150–400)
RBC: 4.51 MIL/uL (ref 3.87–5.11)
RDW: 14.4 % (ref 11.5–15.5)
WBC: 12.9 10*3/uL — AB (ref 4.0–10.5)
nRBC: 0 % (ref 0.0–0.2)

## 2018-02-26 MED ORDER — ACETAMINOPHEN 325 MG PO TABS: 650.0000 mg | ORAL_TABLET | ORAL | 1 refills | Status: AC | PRN

## 2018-02-26 NOTE — Lactation Note (Addendum)
This note was copied from a baby's chart. Lactation Consultation Note: Infant is 4528 hours old. When I arrived in the room infant was very fussy and FOB attempting to sooth infant .  Mother reports that infant has been very gassy and fussy.  Assist mother with placing infant STS and positioning infant in football hold. Observed infant with a shallow latch and compressing mothers nipple flat.  Several attempts to latch with good depth. Mother taught to firm nipple and to use a tea-cup hold to latch infant.  Infant sustained latch for 20 mins. Observed audible swallows. Observed mothers nipple round without compression when infant released the breast. Mother is able to hand express large drops of colostrum.   Assist mother with latching infant to alternate breast. Infant latched with better depth for 15-20 mins.   Infant has a high palate and a short tight anterior frenula.  Reviewed the use of the nipple shield with mother. Advised mother to page for staff nurse or LC to observe the next feeding.  Reviewed cue base feeding and advised mother to feed frequently and allow for cluster feeding.  Advised to breastfeed infant 8-12 times or more in 24 hours.  Mother was given a hand pump and advised to post pump for 15 mins on each breast.  Discussed the hand expression and offering infant additional calories after breastfeeding.   Patient Name: Jaime Arman FilterBailey Sliger ONGEX'BToday's Date: 02/26/2018 Reason for consult: Follow-up assessment   Maternal Data    Feeding Feeding Type: Breast Fed  LATCH Score Latch: Repeated attempts needed to sustain latch, nipple held in mouth throughout feeding, stimulation needed to elicit sucking reflex.  Audible Swallowing: Spontaneous and intermittent  Type of Nipple: Flat  Comfort (Breast/Nipple): Soft / non-tender  Hold (Positioning): Assistance needed to correctly position infant at breast and maintain latch.  LATCH Score: 7  Interventions    Lactation  Tools Discussed/Used     Consult Status      Michel BickersKendrick, Reynoldo Mainer McCoy 02/26/2018, 10:31 AM

## 2018-02-26 NOTE — Discharge Instructions (Signed)
Nothing in vagina for 6 weeks.  No sex, tampons, and douching.  Other instructions as in [facility] Discharge Booklet. °

## 2018-02-26 NOTE — Discharge Summary (Addendum)
OB Discharge Summary     Patient Name: Jaime Herman DOB: 12/23/1995 MRN: [id number]  Date of admission: 02/25/2018 Delivering MD: Cam HaiSHAW, Jaime D   Date of discharge: 02/26/2018  Admitting diagnosis: 40 wks induction Intrauterine pregnancy: 6414w1d     Secondary diagnosis:  Active Problems:   Term pregnancy   SVD (spontaneous vaginal delivery)   Postpartum care following vaginal delivery  Additional problems: none     Discharge diagnosis: Term Pregnancy Delivered                                                                                                Post partum procedures:none  Augmentation: Cytotec  Complications: None  Hospital course:  Induction of Labor With Vaginal Delivery   22 y.o. yo [id number] at 5614w1d was admitted to the hospital 02/25/2018 for induction of labor.  Indication for induction: Favorable cervix at term.  Patient had an uncomplicated labor course as follows: Precipitous delivery after induction of labor with cytotec x 1 and SROM Membrane Rupture Time/Date: 2:40 AM ,02/25/2018   Intrapartum Procedures: Episiotomy: None [1]                                         Lacerations:  2nd degree [3];Perineal [11];Periurethral [8]  Patient had delivery of a Viable infant.  Information for the patient's newborn:  Vanetta ShawlDockery, Boy [name] [[id number]][[id number]]  Delivery Method: Vag-Spont   02/25/2018  Details of delivery can be found in separate delivery note.  Patient had a routine postpartum course. Patient is discharged home 02/26/18.  Physical exam  Vitals:   02/25/18 1230 02/25/18 1630 02/25/18 2123 02/26/18 0654  BP: 112/71 118/75 112/74 124/69  Pulse: 97 87 81 77  Resp: 16 16 16 18   Temp: 98.4 F (36.9 C) 97.8 F (36.6 C) 98.4 F (36.9 C) 98.4 F (36.9 C)  TempSrc: Oral Oral Oral Oral  SpO2: 99% 100%    Weight:      Height:       General: alert, cooperative and no distress Lochia: appropriate Uterine Fundus: firm Incision: N/A DVT Evaluation: No evidence  of DVT seen on physical exam. Labs: Lab Results  Component Value Date   WBC 12.9 (H) 02/26/2018   HGB 12.6 02/26/2018   HCT 38.0 02/26/2018   MCV 84.3 02/26/2018   PLT 210 02/26/2018   CMP Latest Ref Rng & Units 12/30/2015  Glucose 65 - 99 mg/dL 366(Y137(H)  BUN 6 - 20 mg/dL 11  Creatinine 4.030.44 - 4.741.00 mg/dL 2.590.93  Sodium 563135 - 875145 mmol/L 140  Potassium 3.5 - 5.1 mmol/L 3.3(L)  Chloride 101 - 111 mmol/L 109  CO2 22 - 32 mmol/L 20(L)  Calcium 8.9 - 10.3 mg/dL 9.0    Discharge instruction: per After Visit Summary and "Baby and Me Booklet".  After visit meds:  Allergies as of 02/26/2018      Reactions   Ibuprofen Shortness Of Breath   Cant breath      Medication List    TAKE  these medications   acetaminophen 325 MG tablet Commonly known as:  TYLENOL Take 2 tablets (650 mg total) by mouth every 4 (four) hours as needed for moderate pain (for pain scale < 4).   albuterol 108 (90 Base) MCG/ACT inhaler Commonly known as:  PROVENTIL HFA;VENTOLIN HFA Inhale 2-4 puffs into the lungs every 4 (four) hours as needed for wheezing or shortness of breath.   mometasone 220 MCG/INH inhaler Commonly known as:  ASMANEX Inhale 2 puffs into the lungs 2 (two) times daily.   prenatal multivitamin Tabs tablet Take 1 tablet by mouth daily at 12 noon.       Diet: routine diet  Activity: Advance as tolerated. Pelvic rest for 6 weeks.   Outpatient follow up:6 weeks Follow up Appt:No future appointments. Follow up Visit:No follow-ups on file.  Postpartum contraception: Not Discussed  Newborn Data: Live born female  Birth Weight: 7 lb 14.6 oz (3589 g) APGAR: 7, 9  Newborn Delivery   Birth date/time:  02/25/2018 05:17:00 Delivery type:  Vaginal, Spontaneous     Baby Feeding: Breast Disposition:home with mother   02/26/2018 [name], DO   Not discharged 12/8 - D/C to home 12/9.  Otherwise unchanged.

## 2018-02-26 NOTE — Progress Notes (Signed)
Patient ID: [name], female   DOB: 08/07/1995, 22 y.o.   MRN: [id number] Pt doing well with no complaints except fatigue. Bonding well with baby. Breastfeeding with no issues. Ambulating and voiding well. No fever, HA, CP or SOB. Feels ready for discharge to home today VSS ABD - FF, nontender EXT - no homans  12.9>12.6<210  A/P: PPD#1 s/p svd - stable         Routine pp care         Discharge to home today if baby ok for discharge - instructions reviewed

## 2018-02-27 NOTE — Lactation Note (Signed)
This note was copied from a baby's chart. Lactation Consultation Note  Patient Name: Boy Arman FilterBailey Berlanga OZHYQ'MToday's Date: 02/27/2018 Reason for consult: Follow-up assessment Baby is 3651 hours old and at a 8% weight loss.  Mom is feeling very good about the feedings.  Breasts becoming full and transitional milk easily expressed.  Mom states nipple is round when baby comes off.  Mom aware of swallows with feeding.  Discussed milk coming to volume and the prevention and treatment of engorgement.  Mom has a manual pump to take home.  Reviewed use and EBM storage.  Discussed importance of waking techniques and breast massage and compression during feeding.  Lactation outpatient services and support reviewed and encouraged prn.  Maternal Data    Feeding Feeding Type: Breast Fed  LATCH Score                   Interventions    Lactation Tools Discussed/Used     Consult Status Consult Status: Complete Follow-up type: Call as needed    Huston FoleyMOULDEN, Asheton Scheffler S 02/27/2018, 8:57 AM

## 2018-02-27 NOTE — Lactation Note (Addendum)
This note was copied from a baby's chart. Lactation Consultation Note  Patient Name: Jaime Herman RUEAV'WToday's Date: 02/27/2018 Reason for consult: 1st time breastfeeding;Term P1., 43 hours female infant. Per mom, infant had 4 voids and 4 stools in past 24 hours. Per mom, she is feeling confident with BF now. LC did not observe latch [facility]. Per mom, she will  lowers infant jaw and flange  his top lip out  if she feels pinching or will re-latch him to breast. Per mom, Infant is  breastfeeding  now for 15 to 20 minutes most feeds. Mom knows to call for assistance prn  Maternal Data    Feeding Feeding Type: Breast Fed  LATCH Score                   Interventions    Lactation Tools Discussed/Used     Consult Status      Jaime Herman 02/27/2018, 12:20 AM

## 2018-02-27 NOTE — Progress Notes (Addendum)
Post Partum Day 2 Subjective: no complaints, up ad lib, voiding, tolerating PO and nl lochia, pain controlled  Objective: Blood pressure 116/65, pulse 78, temperature 98.4 F (36.9 C), temperature source Oral, resp. rate 16, height 5\' 3"  (1.6 m), weight 98 kg, SpO2 99 %, unknown if currently breastfeeding.  Physical Exam:  General: alert and no distress Lochia: appropriate Uterine Fundus: firm  Recent Labs    02/25/18 0110 02/26/18 0702  HGB 13.1 12.6  HCT 38.8 38.0    Assessment/Plan: Discharge home, Breastfeeding and Lactation consult.  Declined d/c yesterday.  Will d/c with tylenol and PNV.  F/u 6 wks   LOS: 2 days   Jaime Herman 02/27/2018, 7:28 AM

## 2018-03-20 ENCOUNTER — Inpatient Hospital Stay (HOSPITAL_COMMUNITY): Admission: AD | Admit: 2018-03-20 | Payer: 59 | Source: Home / Self Care | Admitting: Obstetrics and Gynecology
